# Patient Record
Sex: Female | Born: 1980 | Race: Asian | Hispanic: No | Marital: Married | Smoking: Smoker, current status unknown
Health system: Southern US, Community
[De-identification: ages and names within clinical notes are randomized; demographics above are authoritative.]

## PROBLEM LIST (undated history)

## (undated) DIAGNOSIS — I10 Essential (primary) hypertension: Secondary | ICD-10-CM

## (undated) HISTORY — DX: Essential (primary) hypertension: I10

---

## 2014-01-09 LAB — OB RESULTS CONSOLE ABO/RH: RH TYPE: POSITIVE

## 2015-02-19 LAB — OB RESULTS CONSOLE HGB/HCT, BLOOD
HEMATOCRIT: 37 %
HEMOGLOBIN: 12.4 g/dL

## 2015-02-19 LAB — OB RESULTS CONSOLE HIV ANTIBODY (ROUTINE TESTING): HIV: NONREACTIVE

## 2015-02-19 LAB — OB RESULTS CONSOLE PLATELET COUNT: PLATELETS: 361 10*3/uL

## 2015-02-19 LAB — OB RESULTS CONSOLE ANTIBODY SCREEN: Antibody Screen: NEGATIVE

## 2015-02-19 LAB — OB RESULTS CONSOLE RPR
RPR: NONREACTIVE
RPR: NONREACTIVE

## 2015-02-19 LAB — OB RESULTS CONSOLE GC/CHLAMYDIA
CHLAMYDIA, DNA PROBE: NEGATIVE
Chlamydia: NEGATIVE
GC PROBE AMP, GENITAL: NEGATIVE
Gonorrhea: NEGATIVE

## 2015-02-19 LAB — OB RESULTS CONSOLE VARICELLA ZOSTER ANTIBODY, IGG: Varicella: IMMUNE

## 2015-02-19 LAB — OB RESULTS CONSOLE RUBELLA ANTIBODY, IGM: RUBELLA: IMMUNE

## 2015-02-19 LAB — OB RESULTS CONSOLE HEPATITIS B SURFACE ANTIGEN: Hepatitis B Surface Ag: NEGATIVE

## 2015-04-06 ENCOUNTER — Encounter: Payer: Self-pay | Admitting: *Deleted

## 2015-04-06 DIAGNOSIS — O0990 Supervision of high risk pregnancy, unspecified, unspecified trimester: Secondary | ICD-10-CM | POA: Insufficient documentation

## 2015-04-06 DIAGNOSIS — O169 Unspecified maternal hypertension, unspecified trimester: Secondary | ICD-10-CM

## 2015-04-09 ENCOUNTER — Encounter: Payer: Self-pay | Admitting: *Deleted

## 2015-04-26 ENCOUNTER — Encounter: Payer: Self-pay | Admitting: Family

## 2015-04-26 ENCOUNTER — Ambulatory Visit (INDEPENDENT_AMBULATORY_CARE_PROVIDER_SITE_OTHER): Payer: Medicaid Other | Admitting: Family

## 2015-04-26 VITALS — BP 120/73 | HR 94 | Temp 98.2°F | Ht 59.0 in | Wt 175.3 lb

## 2015-04-26 DIAGNOSIS — O0990 Supervision of high risk pregnancy, unspecified, unspecified trimester: Secondary | ICD-10-CM

## 2015-04-26 DIAGNOSIS — O162 Unspecified maternal hypertension, second trimester: Secondary | ICD-10-CM

## 2015-04-26 LAB — POCT URINALYSIS DIP (DEVICE)
BILIRUBIN URINE: NEGATIVE
GLUCOSE, UA: NEGATIVE mg/dL
Hgb urine dipstick: NEGATIVE
KETONES UR: NEGATIVE mg/dL
LEUKOCYTES UA: NEGATIVE
Nitrite: NEGATIVE
Protein, ur: NEGATIVE mg/dL
SPECIFIC GRAVITY, URINE: 1.015 (ref 1.005–1.030)
UROBILINOGEN UA: 0.2 mg/dL (ref 0.0–1.0)
pH: 7 (ref 5.0–8.0)

## 2015-04-26 NOTE — Progress Notes (Signed)
   Subjective:    Tamara Lawson is a G2P1001 637w4d being seen today for her first obstetrical visit.  Her obstetrical history is significant for history of chronic hypertension for "a long time", unable to tell the year.  Does not recall taking medication in past.  Transferred from [facility]. Patient does not intend to breast feed. Pregnancy history fully reviewed.  Patient reports no complaints.  Filed Vitals:   04/26/15 0814 04/26/15 0833  BP: 120/73   Pulse: 94   Temp: 98.2 F (36.8 C)   Height:  4\' 11"  (1.499 m)  Weight: 175 lb 4.8 oz (79.516 kg)     HISTORY: OB History  Gravida Para Term Preterm AB SAB TAB Ectopic Multiple Living  2 1 1  0 0 0 0 0 0 1    # Outcome Date GA Lbr Len/2nd Weight Sex Delivery Anes PTL Lv  2 Current           1 Term 06/22/14 7392w0d  8 lb 4 oz (3.742 kg) F Vag-Spont None  Y    Obstetric Comments  Uncomplicated per pt   Past Medical History  Diagnosis Date  . Hypertension     Unable to tell year; "long time"   No past surgical history on file. No family history on file.   Exam    Filed Vitals:   04/26/15 0814 04/26/15 0833  BP: 120/73   Pulse: 94   Temp: 98.2 F (36.8 C)   Height:  4\' 11"  (1.499 m)  Weight: 175 lb 4.8 oz (79.516 kg)     Fetal Status: Fetal Heart Rate (bpm): 152 Fundal Height: 25 cm Movement: Present     General:  Alert, oriented and cooperative. Patient is in no acute distress.  Skin: Skin is warm and dry. No rash noted.   Cardiovascular: Normal heart rate noted  Respiratory: Normal respiratory effort, no problems with respiration noted  Abdomen: Soft, gravid, appropriate for gestational age. Pain/Pressure: Absent     Pelvic: Vag. Bleeding: None     Cervical exam deferred        Extremities: Normal range of motion.  Edema: None  Mental Status: Normal mood and affect. Normal behavior. Normal judgment and thought content.   Urinalysis: Urine Protein: Negative Urine Glucose: Negative     Assessment:    Pregnancy: G2P1001 Patient Active Problem List   Diagnosis Date Noted  . Supervision of high-risk pregnancy 04/06/2015  . Hypertension in pregnancy 04/06/2015        Plan:     Initial labs drawn. Obt[facility] ultrasound [facility]. Problem list reviewed and updated.  Ultrasound discussed; fetal survey: ordered.  Follow up in 3 weeks.   Marlis EdelsonKARIM, Jenaya Saar N 04/26/2015

## 2015-04-26 NOTE — Progress Notes (Signed)
Language Line Used ID: [id number]

## 2015-04-26 NOTE — Progress Notes (Signed)
Anatomy U/S 05/02/15 @ 2p.

## 2015-05-02 ENCOUNTER — Other Ambulatory Visit: Payer: Self-pay | Admitting: Family

## 2015-05-02 ENCOUNTER — Ambulatory Visit (HOSPITAL_COMMUNITY): Admission: RE | Admit: 2015-05-02 | Payer: Medicaid Other | Source: Ambulatory Visit

## 2015-05-02 DIAGNOSIS — O10919 Unspecified pre-existing hypertension complicating pregnancy, unspecified trimester: Secondary | ICD-10-CM

## 2015-05-02 DIAGNOSIS — Z3689 Encounter for other specified antenatal screening: Secondary | ICD-10-CM

## 2015-05-02 DIAGNOSIS — Z3A25 25 weeks gestation of pregnancy: Secondary | ICD-10-CM

## 2015-05-07 ENCOUNTER — Ambulatory Visit (HOSPITAL_COMMUNITY): Payer: Medicaid Other

## 2015-05-08 ENCOUNTER — Ambulatory Visit (HOSPITAL_COMMUNITY)
Admission: RE | Admit: 2015-05-08 | Discharge: 2015-05-08 | Disposition: A | Discharge status: Home / Self Care | Payer: Medicaid Other | Source: Ambulatory Visit | Attending: Family | Admitting: Family

## 2015-05-08 DIAGNOSIS — Z3689 Encounter for other specified antenatal screening: Secondary | ICD-10-CM

## 2015-05-08 DIAGNOSIS — Z36 Encounter for antenatal screening of mother: Secondary | ICD-10-CM | POA: Diagnosis not present

## 2015-05-08 DIAGNOSIS — O10012 Pre-existing essential hypertension complicating pregnancy, second trimester: Secondary | ICD-10-CM | POA: Insufficient documentation

## 2015-05-08 DIAGNOSIS — O0990 Supervision of high risk pregnancy, unspecified, unspecified trimester: Secondary | ICD-10-CM

## 2015-05-08 DIAGNOSIS — O10919 Unspecified pre-existing hypertension complicating pregnancy, unspecified trimester: Secondary | ICD-10-CM

## 2015-05-08 DIAGNOSIS — Z3A25 25 weeks gestation of pregnancy: Secondary | ICD-10-CM

## 2015-05-08 DIAGNOSIS — Z3A26 26 weeks gestation of pregnancy: Secondary | ICD-10-CM | POA: Diagnosis not present

## 2015-05-17 ENCOUNTER — Telehealth: Payer: Self-pay | Admitting: *Deleted

## 2015-05-17 ENCOUNTER — Encounter: Payer: Self-pay | Admitting: Family Medicine

## 2015-05-17 ENCOUNTER — Ambulatory Visit (INDEPENDENT_AMBULATORY_CARE_PROVIDER_SITE_OTHER): Payer: Medicaid Other | Admitting: Family Medicine

## 2015-05-17 VITALS — BP 121/75 | HR 95 | Temp 99.5°F | Wt 178.0 lb

## 2015-05-17 DIAGNOSIS — O0992 Supervision of high risk pregnancy, unspecified, second trimester: Secondary | ICD-10-CM

## 2015-05-17 DIAGNOSIS — O162 Unspecified maternal hypertension, second trimester: Secondary | ICD-10-CM

## 2015-05-17 DIAGNOSIS — Z789 Other specified health status: Secondary | ICD-10-CM | POA: Diagnosis not present

## 2015-05-17 LAB — CBC
HEMATOCRIT: 31.3 % — AB (ref 36.0–46.0)
HEMOGLOBIN: 11 g/dL — AB (ref 12.0–15.0)
MCH: 30.3 pg (ref 26.0–34.0)
MCHC: 35.1 g/dL (ref 30.0–36.0)
MCV: 86.2 fL (ref 78.0–100.0)
MPV: 9 fL (ref 8.6–12.4)
Platelets: 295 10*3/uL (ref 150–400)
RBC: 3.63 MIL/uL — ABNORMAL LOW (ref 3.87–5.11)
RDW: 13.5 % (ref 11.5–15.5)
WBC: 10.7 10*3/uL — AB (ref 4.0–10.5)

## 2015-05-17 LAB — POCT URINALYSIS DIP (DEVICE)
BILIRUBIN URINE: NEGATIVE
GLUCOSE, UA: NEGATIVE mg/dL
Ketones, ur: NEGATIVE mg/dL
NITRITE: NEGATIVE
Protein, ur: NEGATIVE mg/dL
Specific Gravity, Urine: 1.015 (ref 1.005–1.030)
UROBILINOGEN UA: 0.2 mg/dL (ref 0.0–1.0)
pH: 7 (ref 5.0–8.0)

## 2015-05-17 MED ORDER — ASPIRIN 81 MG PO CHEW: 81.0000 mg | CHEWABLE_TABLET | Freq: Every day | ORAL | Status: DC

## 2015-05-17 NOTE — Telephone Encounter (Signed)
Attempted to call patient regarding name of pharmacy so that we can call her medicine in.

## 2015-05-17 NOTE — Progress Notes (Signed)
Subjective:  Tamara Lawson is a 34 y.o. G2P1001 at 4218w4d being seen today for ongoing prenatal care.  She is currently monitored for the following issues for this high-risk pregnancy and has Supervision of high-risk pregnancy; Hypertension in pregnancy; and Language barrier on her problem list.  Patient reports no complaints.  Contractions: Not present.  .  Movement: Present. Denies leaking of fluid.   The following portions of the patient's history were reviewed and updated as appropriate: allergies, current medications, past family history, past medical history, past social history, past surgical history and problem list. Problem list updated.  Objective:   Filed Vitals:   05/17/15 1117  BP: 121/75  Pulse: 95  Temp: 99.5 F (37.5 C)  Weight: 178 lb (80.74 kg)    Fetal Status: Fetal Heart Rate (bpm): 150 Fundal Height: 27 cm Movement: Present     General:  Alert, oriented and cooperative. Patient is in no acute distress.  Skin: Skin is warm and dry. No rash noted.   Cardiovascular: Normal heart rate noted  Respiratory: Normal respiratory effort, no problems with respiration noted  Abdomen: Soft, gravid, appropriate for gestational age. Pain/Pressure: Present     Extremities: Normal range of motion.  Edema: Trace  Mental Status: Normal mood and affect. Normal behavior. Normal judgment and thought content.   Urinalysis: Urine Protein: Negative Urine Glucose: Negative  Assessment and Plan:  Pregnancy: G2P1001 at 1918w4d  1. Hypertension in pregnancy, second trimester U/s for growth in 4 wks Begin ASA - US MFM OB FOLLOW UP; Future - aspirin 81 MG chewable tablet; Chew 1 tablet (81 mg total) by mouth daily.  Dispense: 90 tablet; Refill: 1  2. Supervision of high-risk pregnancy, second trimester 28 wk labs - CBC - HIV antibody - RPR - Glucose Tolerance, 1 HR (50g)  3. Language barrier [place] interpreter used--has transportation issues--will have her see SW   Preterm labor  symptoms and general obstetric precautions including but not limited to vaginal bleeding, contractions, leaking of fluid and fetal movement were reviewed in detail with the patient. Please refer to After Visit Summary for other counseling recommendations.  Return in 2 weeks (on 05/31/2015).   Reva Boresanya S Tayten Heber, MD

## 2015-05-17 NOTE — Telephone Encounter (Signed)
Patient left without getting prescription for ASA 81mg .  No pharmacy listed, cannot eprescribe. Called patient with Clydie BraunKaren interpreter via [employer]. No answer. Need to call patient back to pick up rx or give us a pharmacy to eprescribe. Next appt not until 05/31/15.

## 2015-05-17 NOTE — Patient Instructions (Signed)
Third Trimester of Pregnancy The third trimester is from week 29 through week 42, months 7 through 9. The third trimester is a time when the fetus is growing rapidly. At the end of the ninth month, the fetus is about 20 inches in length and weighs 6-10 pounds.  BODY CHANGES Your body goes through many changes during pregnancy. The changes vary from woman to woman.   Your weight will continue to increase. You can expect to gain 25-35 pounds (11-16 kg) by the end of the pregnancy.  You may begin to get stretch marks on your hips, abdomen, and breasts.  You may urinate more often because the fetus is moving lower into your pelvis and pressing on your bladder.  You may develop or continue to have heartburn as a result of your pregnancy.  You may develop constipation because certain hormones are causing the muscles that push waste through your intestines to slow down.  You may develop hemorrhoids or swollen, bulging veins (varicose veins).  You may have pelvic pain because of the weight gain and pregnancy hormones relaxing your joints between the bones in your pelvis. Backaches may result from overexertion of the muscles supporting your posture.  You may have changes in your hair. These can include thickening of your hair, rapid growth, and changes in texture. Some women also have hair loss during or after pregnancy, or hair that feels dry or thin. Your hair will most likely return to normal after your baby is born.  Your breasts will continue to grow and be tender. A yellow discharge may leak from your breasts called colostrum.  Your belly button may stick out.  You may feel short of breath because of your expanding uterus.  You may notice the fetus "dropping," or moving lower in your abdomen.  You may have a bloody mucus discharge. This usually occurs a few days to a week before labor begins.  Your cervix becomes thin and soft (effaced) near your due date. WHAT TO EXPECT AT YOUR  PRENATAL EXAMS  You will have prenatal exams every 2 weeks until week 36. Then, you will have weekly prenatal exams. During a routine prenatal visit:  You will be weighed to make sure you and the fetus are growing normally.  Your blood pressure is taken.  Your abdomen will be measured to track your baby's growth.  The fetal heartbeat will be listened to.  Any test results from the previous visit will be discussed.  You may have a cervical check near your due date to see if you have effaced. At around 36 weeks, your caregiver will check your cervix. At the same time, your caregiver will also perform a test on the secretions of the vaginal tissue. This test is to determine if a type of bacteria, Group B streptococcus, is present. Your caregiver will explain this further. Your caregiver may ask you:  What your birth plan is.  How you are feeling.  If you are feeling the baby move.  If you have had any abnormal symptoms, such as leaking fluid, bleeding, severe headaches, or abdominal cramping.  If you are using any tobacco products, including cigarettes, chewing tobacco, and electronic cigarettes.  If you have any questions. Other tests or screenings that may be performed during your third trimester include:  Blood tests that check for low iron levels (anemia).  Fetal testing to check the health, activity level, and growth of the fetus. Testing is done if you have certain medical conditions or if   there are problems during the pregnancy.  HIV (human immunodeficiency virus) testing. If you are at high risk, you may be screened for HIV during your third trimester of pregnancy. FALSE LABOR You may feel small, irregular contractions that eventually go away. These are called Braxton Hicks contractions, or false labor. Contractions may last for hours, days, or even weeks before true labor sets in. If contractions come at regular intervals, intensify, or become painful, it is best to be seen  by your caregiver.  SIGNS OF LABOR   Menstrual-like cramps.  Contractions that are 5 minutes apart or less.  Contractions that start on the top of the uterus and spread down to the lower abdomen and back.  A sense of increased pelvic pressure or back pain.  A watery or bloody mucus discharge that comes from the vagina. If you have any of these signs before the 37th week of pregnancy, call your caregiver right away. You need to go to the hospital to get checked immediately. HOME CARE INSTRUCTIONS   Avoid all smoking, herbs, alcohol, and unprescribed drugs. These chemicals affect the formation and growth of the baby.  Do not use any tobacco products, including cigarettes, chewing tobacco, and electronic cigarettes. If you need help quitting, ask your health care provider. You may receive counseling support and other resources to help you quit.  Follow your caregiver's instructions regarding medicine use. There are medicines that are either safe or unsafe to take during pregnancy.  Exercise only as directed by your caregiver. Experiencing uterine cramps is a good sign to stop exercising.  Continue to eat regular, healthy meals.  Wear a good support bra for breast tenderness.  Do not use hot tubs, steam rooms, or saunas.  Wear your seat belt at all times when driving.  Avoid raw meat, uncooked cheese, cat litter boxes, and soil used by cats. These carry germs that can cause birth defects in the baby.  Take your prenatal vitamins.  Take 1500-2000 mg of calcium daily starting at the 20th week of pregnancy until you deliver your baby.  Try taking a stool softener (if your caregiver approves) if you develop constipation. Eat more high-fiber foods, such as fresh vegetables or fruit and whole grains. Drink plenty of fluids to keep your urine clear or pale yellow.  Take warm sitz baths to soothe any pain or discomfort caused by hemorrhoids. Use hemorrhoid cream if your caregiver  approves.  If you develop varicose veins, wear support hose. Elevate your feet for 15 minutes, 3-4 times a day. Limit salt in your diet.  Avoid heavy lifting, wear low heal shoes, and practice good posture.  Rest a lot with your legs elevated if you have leg cramps or low back pain.  Visit your dentist if you have not gone during your pregnancy. Use a soft toothbrush to brush your teeth and be gentle when you floss.  A sexual relationship may be continued unless your caregiver directs you otherwise.  Do not travel far distances unless it is absolutely necessary and only with the approval of your caregiver.  Take prenatal classes to understand, practice, and ask questions about the labor and delivery.  Make a trial run to the hospital.  Pack your hospital bag.  Prepare the baby's nursery.  Continue to go to all your prenatal visits as directed by your caregiver. SEEK MEDICAL CARE IF:  You are unsure if you are in labor or if your water has broken.  You have dizziness.  You have   mild pelvic cramps, pelvic pressure, or nagging pain in your abdominal area.  You have persistent nausea, vomiting, or diarrhea.  You have a bad smelling vaginal discharge.  You have pain with urination. SEEK IMMEDIATE MEDICAL CARE IF:   You have a fever.  You are leaking fluid from your vagina.  You have spotting or bleeding from your vagina.  You have severe abdominal cramping or pain.  You have rapid weight loss or gain.  You have shortness of breath with chest pain.  You notice sudden or extreme swelling of your face, hands, ankles, feet, or legs.  You have not felt your baby move in over an hour.  You have severe headaches that do not go away with medicine.  You have vision changes.   This information is not intended to replace advice given to you by your health care provider. Make sure you discuss any questions you have with your health care provider.   Document Released:  04/29/2001 Document Revised: 05/26/2014 Document Reviewed: 07/06/2012 Elsevier Interactive Patient Education 2016 [employer]  Breastfeeding Deciding to breastfeed is one of the best choices you can make for you and your baby. A change in hormones during pregnancy causes your breast tissue to grow and increases the number and size of your milk ducts. These hormones also allow proteins, sugars, and fats from your blood supply to make breast milk in your milk-producing glands. Hormones prevent breast milk from being released before your baby is born as well as prompt milk flow after birth. Once breastfeeding has begun, thoughts of your baby, as well as his or her sucking or crying, can stimulate the release of milk from your milk-producing glands.  BENEFITS OF BREASTFEEDING For Your Baby  Your first milk (colostrum) helps your baby's digestive system function better.  There are antibodies in your milk that help your baby fight off infections.  Your baby has a lower incidence of asthma, allergies, and sudden infant death syndrome.  The nutrients in breast milk are better for your baby than infant formulas and are designed uniquely for your baby's needs.  Breast milk improves your baby's brain development.  Your baby is less likely to develop other conditions, such as childhood obesity, asthma, or type 2 diabetes mellitus. For You  Breastfeeding helps to create a very special bond between you and your baby.  Breastfeeding is convenient. Breast milk is always available at the correct temperature and costs nothing.  Breastfeeding helps to burn calories and helps you lose the weight gained during pregnancy.  Breastfeeding makes your uterus contract to its prepregnancy size faster and slows bleeding (lochia) after you give birth.   Breastfeeding helps to lower your risk of developing type 2 diabetes mellitus, osteoporosis, and breast or ovarian cancer later in life. SIGNS THAT YOUR BABY IS  HUNGRY Early Signs of Hunger  Increased alertness or activity.  Stretching.  Movement of the head from side to side.  Movement of the head and opening of the mouth when the corner of the mouth or cheek is stroked (rooting).  Increased sucking sounds, smacking lips, cooing, sighing, or squeaking.  Hand-to-mouth movements.  Increased sucking of fingers or hands. Late Signs of Hunger  Fussing.  Intermittent crying. Extreme Signs of Hunger Signs of extreme hunger will require calming and consoling before your baby will be able to breastfeed successfully. Do not wait for the following signs of extreme hunger to occur before you initiate breastfeeding:  Restlessness.  A loud, strong cry.  Screaming.   BREASTFEEDING BASICS Breastfeeding Initiation  Find a comfortable place to sit or lie down, with your neck and back well supported.  Place a pillow or rolled up blanket under your baby to bring him or her to the level of your breast (if you are seated). Nursing pillows are specially designed to help support your arms and your baby while you breastfeed.  Make sure that your baby's abdomen is facing your abdomen.  Gently massage your breast. With your fingertips, massage from your chest wall toward your nipple in a circular motion. This encourages milk flow. You may need to continue this action during the feeding if your milk flows slowly.  Support your breast with 4 fingers underneath and your thumb above your nipple. Make sure your fingers are well away from your nipple and your baby's mouth.  Stroke your baby's lips gently with your finger or nipple.  When your baby's mouth is open wide enough, quickly bring your baby to your breast, placing your entire nipple and as much of the colored area around your nipple (areola) as possible into your baby's mouth.  More areola should be visible above your baby's upper lip than below the lower lip.  Your baby's tongue should be between his  or her lower gum and your breast.  Ensure that your baby's mouth is correctly positioned around your nipple (latched). Your baby's lips should create a seal on your breast and be turned out (everted).  It is common for your baby to suck about 2-3 minutes in order to start the flow of breast milk. Latching Teaching your baby how to latch on to your breast properly is very important. An improper latch can cause nipple pain and decreased milk supply for you and poor weight gain in your baby. Also, if your baby is not latched onto your nipple properly, he or she may swallow some air during feeding. This can make your baby fussy. Burping your baby when you switch breasts during the feeding can help to get rid of the air. However, teaching your baby to latch on properly is still the best way to prevent fussiness from swallowing air while breastfeeding. Signs that your baby has successfully latched on to your nipple:  Silent tugging or silent sucking, without causing you pain.  Swallowing heard between every 3-4 sucks.  Muscle movement above and in front of his or her ears while sucking. Signs that your baby has not successfully latched on to nipple:  Sucking sounds or smacking sounds from your baby while breastfeeding.  Nipple pain. If you think your baby has not latched on correctly, slip your finger into the corner of your baby's mouth to break the suction and place it between your baby's gums. Attempt breastfeeding initiation again. Signs of Successful Breastfeeding Signs from your baby:  A gradual decrease in the number of sucks or complete cessation of sucking.  Falling asleep.  Relaxation of his or her body.  Retention of a small amount of milk in his or her mouth.  Letting go of your breast by himself or herself. Signs from you:  Breasts that have increased in firmness, weight, and size 1-3 hours after feeding.  Breasts that are softer immediately after  breastfeeding.  Increased milk volume, as well as a change in milk consistency and color by the fifth day of breastfeeding.  Nipples that are not sore, cracked, or bleeding. Signs That Your Baby is Getting Enough Milk  Wetting at least 3 diapers in a 24-hour period.   The urine should be clear and pale yellow by age 5 days.  At least 3 stools in a 24-hour period by age 5 days. The stool should be soft and yellow.  At least 3 stools in a 24-hour period by age 7 days. The stool should be seedy and yellow.  No loss of weight greater than 10% of birth weight during the first 3 days of age.  Average weight gain of 4-7 ounces (113-198 g) per week after age 4 days.  Consistent daily weight gain by age 5 days, without weight loss after the age of 2 weeks. After a feeding, your baby may spit up a small amount. This is common. BREASTFEEDING FREQUENCY AND DURATION Frequent feeding will help you make more milk and can prevent sore nipples and breast engorgement. Breastfeed when you feel the need to reduce the fullness of your breasts or when your baby shows signs of hunger. This is called "breastfeeding on demand." Avoid introducing a pacifier to your baby while you are working to establish breastfeeding (the first 4-6 weeks after your baby is born). After this time you may choose to use a pacifier. Research has shown that pacifier use during the first year of a baby's life decreases the risk of sudden infant death syndrome (SIDS). Allow your baby to feed on each breast as long as he or she wants. Breastfeed until your baby is finished feeding. When your baby unlatches or falls asleep while feeding from the first breast, offer the second breast. Because newborns are often sleepy in the first few weeks of life, you may need to awaken your baby to get him or her to feed. Breastfeeding times will vary from baby to baby. However, the following rules can serve as a guide to help you ensure that your baby is  properly fed:  Newborns (babies 4 weeks of age or younger) may breastfeed every 1-3 hours.  Newborns should not go longer than 3 hours during the day or 5 hours during the night without breastfeeding.  You should breastfeed your baby a minimum of 8 times in a 24-hour period until you begin to introduce solid foods to your baby at around 6 months of age. BREAST MILK PUMPING Pumping and storing breast milk allows you to ensure that your baby is exclusively fed your breast milk, even at times when you are unable to breastfeed. This is especially important if you are going back to work while you are still breastfeeding or when you are not able to be present during feedings. Your lactation consultant can give you guidelines on how long it is safe to store breast milk. A breast pump is a machine that allows you to pump milk from your breast into a sterile bottle. The pumped breast milk can then be stored in a refrigerator or freezer. Some breast pumps are operated by hand, while others use electricity. Ask your lactation consultant which type will work best for you. Breast pumps can be purchased, but some hospitals and breastfeeding support groups lease breast pumps on a monthly basis. A lactation consultant can teach you how to hand express breast milk, if you prefer not to use a pump. CARING FOR YOUR BREASTS WHILE YOU BREASTFEED Nipples can become dry, cracked, and sore while breastfeeding. The following recommendations can help keep your breasts moisturized and healthy:  Avoid using soap on your nipples.  Wear a supportive bra. Although not required, special nursing bras and tank tops are designed to allow access to your   breasts for breastfeeding without taking off your entire bra or top. Avoid wearing underwire-style bras or extremely tight bras.  Air dry your nipples for 3-4minutes after each feeding.  Use only cotton bra pads to absorb leaked breast milk. Leaking of breast milk between feedings  is normal.  Use lanolin on your nipples after breastfeeding. Lanolin helps to maintain your skin's normal moisture barrier. If you use pure lanolin, you do not need to wash it off before feeding your baby again. Pure lanolin is not toxic to your baby. You may also hand express a few drops of breast milk and gently massage that milk into your nipples and allow the milk to air dry. In the first few weeks after giving birth, some women experience extremely full breasts (engorgement). Engorgement can make your breasts feel heavy, warm, and tender to the touch. Engorgement peaks within 3-5 days after you give birth. The following recommendations can help ease engorgement:  Completely empty your breasts while breastfeeding or pumping. You may want to start by applying warm, moist heat (in the shower or with warm water-soaked hand towels) just before feeding or pumping. This increases circulation and helps the milk flow. If your baby does not completely empty your breasts while breastfeeding, pump any extra milk after he or she is finished.  Wear a snug bra (nursing or regular) or tank top for 1-2 days to signal your body to slightly decrease milk production.  Apply ice packs to your breasts, unless this is too uncomfortable for you.  Make sure that your baby is latched on and positioned properly while breastfeeding. If engorgement persists after 48 hours of following these recommendations, contact your health care provider or a lactation consultant. OVERALL HEALTH CARE RECOMMENDATIONS WHILE BREASTFEEDING  Eat healthy foods. Alternate between meals and snacks, eating 3 of each per day. Because what you eat affects your breast milk, some of the foods may make your baby more irritable than usual. Avoid eating these foods if you are sure that they are negatively affecting your baby.  Drink milk, fruit juice, and water to satisfy your thirst (about 10 glasses a day).  Rest often, relax, and continue to take  your prenatal vitamins to prevent fatigue, stress, and anemia.  Continue breast self-awareness checks.  Avoid chewing and smoking tobacco. Chemicals from cigarettes that pass into breast milk and exposure to secondhand smoke may harm your baby.  Avoid alcohol and drug use, including marijuana. Some medicines that may be harmful to your baby can pass through breast milk. It is important to ask your health care provider before taking any medicine, including all over-the-counter and prescription medicine as well as vitamin and herbal supplements. It is possible to become pregnant while breastfeeding. If birth control is desired, ask your health care provider about options that will be safe for your baby. SEEK MEDICAL CARE IF:  You feel like you want to stop breastfeeding or have become frustrated with breastfeeding.  You have painful breasts or nipples.  Your nipples are cracked or bleeding.  Your breasts are red, tender, or warm.  You have a swollen area on either breast.  You have a fever or chills.  You have nausea or vomiting.  You have drainage other than breast milk from your nipples.  Your breasts do not become full before feedings by the fifth day after you give birth.  You feel sad and depressed.  Your baby is too sleepy to eat well.  Your baby is having trouble sleeping.     Your baby is wetting less than 3 diapers in a 24-hour period.  Your baby has less than 3 stools in a 24-hour period.  Your baby's skin or the white part of his or her eyes becomes yellow.   Your baby is not gaining weight by 5 days of age. SEEK IMMEDIATE MEDICAL CARE IF:  Your baby is overly tired (lethargic) and does not want to wake up and feed.  Your baby develops an unexplained fever.   This information is not intended to replace advice given to you by your health care provider. Make sure you discuss any questions you have with your health care provider.   Document Released: 05/05/2005  Document Revised: 01/24/2015 Document Reviewed: 10/27/2012 Elsevier Interactive Patient Education 2016 [employer]  

## 2015-05-18 LAB — GLUCOSE TOLERANCE, 1 HOUR (50G) W/O FASTING: GLUCOSE 1 HOUR GTT: 115 mg/dL (ref 70–140)

## 2015-05-18 LAB — HIV ANTIBODY (ROUTINE TESTING W REFLEX): HIV: NONREACTIVE

## 2015-05-18 LAB — RPR

## 2015-05-24 MED ORDER — ASPIRIN 81 MG PO CHEW: 81.0000 mg | CHEWABLE_TABLET | Freq: Every day | ORAL | Status: AC

## 2015-05-24 NOTE — Addendum Note (Signed)
Addended by: Garret ReddishBARNES, Amiah Frohlich M on: 05/24/2015 05:14 PM   Modules accepted: Orders

## 2015-05-24 NOTE — Telephone Encounter (Signed)
Called patient using [employer] language line Clydie BraunKaren interpreter. Obtained pharmacy info from patient and sent in prescription. Patient voiced understanding of taking ASA 81mg  daily. She asked about transportation to her next appointment,said he had medicaid and was trying to contact transportation but had not heard back from anyone. Will inbox the front office staff so that they can help her with this. Patient voiced understanding.

## 2015-05-31 ENCOUNTER — Ambulatory Visit (INDEPENDENT_AMBULATORY_CARE_PROVIDER_SITE_OTHER): Payer: Medicaid Other | Admitting: Family Medicine

## 2015-05-31 VITALS — BP 128/78 | HR 96 | Temp 98.6°F | Wt 178.1 lb

## 2015-05-31 DIAGNOSIS — O162 Unspecified maternal hypertension, second trimester: Secondary | ICD-10-CM

## 2015-05-31 DIAGNOSIS — Z789 Other specified health status: Secondary | ICD-10-CM | POA: Diagnosis not present

## 2015-05-31 DIAGNOSIS — O0992 Supervision of high risk pregnancy, unspecified, second trimester: Secondary | ICD-10-CM

## 2015-05-31 LAB — POCT URINALYSIS DIP (DEVICE)
BILIRUBIN URINE: NEGATIVE
GLUCOSE, UA: NEGATIVE mg/dL
HGB URINE DIPSTICK: NEGATIVE
KETONES UR: NEGATIVE mg/dL
Nitrite: NEGATIVE
Protein, ur: NEGATIVE mg/dL
SPECIFIC GRAVITY, URINE: 1.01 (ref 1.005–1.030)
Urobilinogen, UA: 0.2 mg/dL (ref 0.0–1.0)
pH: 6 (ref 5.0–8.0)

## 2015-05-31 NOTE — Progress Notes (Signed)
Subjective:  Tamara Lawson is a 35 y.o. G2P1001 at 5143w4d being seen today for ongoing prenatal care.  She is currently monitored for the following issues for this high-risk pregnancy and has Supervision of high-risk pregnancy; Hypertension in pregnancy; and Language barrier on her problem list.  Patient reports no complaints.  Contractions: Not present.  .  Movement: Present. Denies leaking of fluid.   The following portions of the patient's history were reviewed and updated as appropriate: allergies, current medications, past family history, past medical history, past social history, past surgical history and problem list. Problem list updated.  Objective:   Filed Vitals:   05/31/15 1047  BP: 128/78  Pulse: 96  Temp: 98.6 F (37 C)  Weight: 178 lb 1.6 oz (80.786 kg)    Fetal Status:   Fundal Height: 30 cm Movement: Present  Presentation: Vertex  General:  Alert, oriented and cooperative. Patient is in no acute distress.  Skin: Skin is warm and dry. No rash noted.   Cardiovascular: Normal heart rate noted  Respiratory: Normal respiratory effort, no problems with respiration noted  Abdomen: Soft, gravid, appropriate for gestational age. Pain/Pressure: Present     Pelvic:       Cervical exam deferred        Extremities: Normal range of motion.  Edema: None  Mental Status: Normal mood and affect. Normal behavior. Normal judgment and thought content.   Urinalysis:      Assessment and Plan:  Pregnancy: G2P1001 at 9043w4d  1. Hypertension in pregnancy, second trimester BP wnl today Discussed starting NST testing next visit, she is concerned about transportation, perhaps BPP weekly will work better --discuss next visit  2. Supervision of high-risk pregnancy, second trimester Updated box  3. Language barrier Interpreter present  Preterm labor symptoms and general obstetric precautions including but not limited to vaginal bleeding, contractions, leaking of fluid and fetal movement were  reviewed in detail with the patient. Please refer to After Visit Summary for other counseling recommendations.  Return in about 3 weeks (around 06/21/2015) for Routine prenatal care, start NST2xweekly at this visit.  Future Appointments Date Time Provider Department Center  06/14/2015 9:30 AM [facility] US 2 [facility]  06/21/2015 10:45 AM Adam PhenixJames G Arnold[facility]   Federico FlakeKimberly Niles Chakira Jachim, MD

## 2015-05-31 NOTE — Patient Instructions (Signed)

## 2015-06-13 ENCOUNTER — Other Ambulatory Visit: Payer: Self-pay | Admitting: Family Medicine

## 2015-06-13 DIAGNOSIS — O10913 Unspecified pre-existing hypertension complicating pregnancy, third trimester: Secondary | ICD-10-CM

## 2015-06-13 DIAGNOSIS — Z3A31 31 weeks gestation of pregnancy: Secondary | ICD-10-CM

## 2015-06-14 ENCOUNTER — Ambulatory Visit (HOSPITAL_COMMUNITY)
Admission: RE | Admit: 2015-06-14 | Discharge: 2015-06-14 | Disposition: A | Discharge status: Home / Self Care | Payer: Medicaid Other | Source: Ambulatory Visit | Attending: Family Medicine | Admitting: Family Medicine

## 2015-06-14 ENCOUNTER — Encounter (HOSPITAL_COMMUNITY): Payer: Self-pay

## 2015-06-14 DIAGNOSIS — O10913 Unspecified pre-existing hypertension complicating pregnancy, third trimester: Secondary | ICD-10-CM

## 2015-06-14 DIAGNOSIS — O10013 Pre-existing essential hypertension complicating pregnancy, third trimester: Secondary | ICD-10-CM | POA: Insufficient documentation

## 2015-06-14 DIAGNOSIS — Z3A31 31 weeks gestation of pregnancy: Secondary | ICD-10-CM | POA: Diagnosis not present

## 2015-06-21 ENCOUNTER — Ambulatory Visit (INDEPENDENT_AMBULATORY_CARE_PROVIDER_SITE_OTHER): Payer: Medicaid Other | Admitting: Obstetrics & Gynecology

## 2015-06-21 VITALS — BP 115/75 | HR 95 | Temp 98.0°F | Wt 180.0 lb

## 2015-06-21 DIAGNOSIS — O163 Unspecified maternal hypertension, third trimester: Secondary | ICD-10-CM

## 2015-06-21 LAB — POCT URINALYSIS DIP (DEVICE)
Bilirubin Urine: NEGATIVE
GLUCOSE, UA: NEGATIVE mg/dL
Hgb urine dipstick: NEGATIVE
Ketones, ur: 15 mg/dL — AB
NITRITE: NEGATIVE
PH: 7 (ref 5.0–8.0)
PROTEIN: NEGATIVE mg/dL
Specific Gravity, Urine: 1.015 (ref 1.005–1.030)
Urobilinogen, UA: 0.2 mg/dL (ref 0.0–1.0)

## 2015-06-21 NOTE — Progress Notes (Signed)
Used [phone].

## 2015-06-21 NOTE — Progress Notes (Signed)
Normal growth on [place] 1 week ago Subjective:  Tamara Lawson is a 35 y.o. G2P1001 at [redacted]w[redacted]d being seen today for ongoing prenatal care.  She is currently monitored for the following issues for this high-risk pregnancy and has Supervision of high-risk pregnancy; Hypertension in pregnancy; and Language barrier on her problem list.  Patient reports no complaints.  Contractions: Not present. Vag. Bleeding: None.  Movement: Present. Denies leaking of fluid.   The following portions of the patient's history were reviewed and updated as appropriate: allergies, current medications, past family history, past medical history, past social history, past surgical history and problem list. Problem list updated.  Objective:   Filed Vitals:   06/21/15 1127  BP: 115/75  Pulse: 95  Temp: 98 F (36.7 C)  Weight: 180 lb (81.647 kg)    Fetal Status: Fetal Heart Rate (bpm): 137 Fundal Height: 34 cm Movement: Present     General:  Alert, oriented and cooperative. Patient is in no acute distress.  Skin: Skin is warm and dry. No rash noted.   Cardiovascular: Normal heart rate noted  Respiratory: Normal respiratory effort, no problems with respiration noted  Abdomen: Soft, gravid, appropriate for gestational age. Pain/Pressure: Absent     Pelvic: Vag. Bleeding: None     Cervical exam deferred        Extremities: Normal range of motion.  Edema: None  Mental Status: Normal mood and affect. Normal behavior. Normal judgment and thought content.   Urinalysis:      Assessment and Plan:  Pregnancy: G2P1001 at [redacted]w[redacted]d  1. Hypertension in pregnancy, third trimester State fetal testing today - Fetal nonstress test normotensive Preterm labor symptoms and general obstetric precautions including but not limited to vaginal bleeding, contractions, leaking of fluid and fetal movement were reviewed in detail with the patient. Please refer to After Visit Summary for other counseling recommendations.  Return in about 1 week  (around 06/28/2015). NST 2/week  Adam Phenix, MD

## 2015-06-21 NOTE — Patient Instructions (Signed)

## 2015-06-26 ENCOUNTER — Ambulatory Visit (INDEPENDENT_AMBULATORY_CARE_PROVIDER_SITE_OTHER): Payer: Medicaid Other | Admitting: *Deleted

## 2015-06-26 VITALS — BP 110/67 | HR 84

## 2015-06-26 DIAGNOSIS — O163 Unspecified maternal hypertension, third trimester: Secondary | ICD-10-CM | POA: Diagnosis present

## 2015-06-26 DIAGNOSIS — Z36 Encounter for antenatal screening of mother: Secondary | ICD-10-CM

## 2015-06-26 NOTE — Progress Notes (Signed)
NST reviewed and reactive.  

## 2015-06-26 NOTE — Progress Notes (Signed)
Interpreter present for encounter.  

## 2015-06-28 ENCOUNTER — Ambulatory Visit (INDEPENDENT_AMBULATORY_CARE_PROVIDER_SITE_OTHER): Payer: Medicaid Other | Admitting: Obstetrics & Gynecology

## 2015-06-28 VITALS — BP 129/79 | HR 89 | Wt 179.3 lb

## 2015-06-28 DIAGNOSIS — O163 Unspecified maternal hypertension, third trimester: Secondary | ICD-10-CM | POA: Diagnosis not present

## 2015-06-28 DIAGNOSIS — O0993 Supervision of high risk pregnancy, unspecified, third trimester: Secondary | ICD-10-CM

## 2015-06-28 LAB — POCT URINALYSIS DIP (DEVICE)
Bilirubin Urine: NEGATIVE
Glucose, UA: NEGATIVE mg/dL
HGB URINE DIPSTICK: NEGATIVE
Ketones, ur: NEGATIVE mg/dL
Leukocytes, UA: NEGATIVE
NITRITE: NEGATIVE
PH: 7 (ref 5.0–8.0)
PROTEIN: NEGATIVE mg/dL
SPECIFIC GRAVITY, URINE: 1.015 (ref 1.005–1.030)
UROBILINOGEN UA: 0.2 mg/dL (ref 0.0–1.0)

## 2015-06-28 NOTE — Progress Notes (Signed)
Interpreter Win Khine present for encounter.  Breastfeeding tip of the week reviewed. Pt needs to meet w/Social Worker today to arrange transportation to her visits.

## 2015-06-28 NOTE — Progress Notes (Signed)
NST reactive Subjective:  Tamara Lawson is a 35 y.o. G2P1001 at [redacted]w[redacted]d being seen today for ongoing prenatal care.  She is currently monitored for the following issues for this high-risk pregnancy and has Supervision of high-risk pregnancy; Hypertension in pregnancy; and Language barrier on her problem list.  Patient reports no complaints.  Contractions: Not present. Vag. Bleeding: None.  Movement: Present. Denies leaking of fluid.   The following portions of the patient's history were reviewed and updated as appropriate: allergies, current medications, past family history, past medical history, past social history, past surgical history and problem list. Problem list updated.  Objective:   Filed Vitals:   06/28/15 0854  BP: 129/79  Pulse: 89  Weight: 179 lb 4.8 oz (81.33 kg)    Fetal Status: Fetal Heart Rate (bpm): NST   Movement: Present     General:  Alert, oriented and cooperative. Patient is in no acute distress.  Skin: Skin is warm and dry. No rash noted.   Cardiovascular: Normal heart rate noted  Respiratory: Normal respiratory effort, no problems with respiration noted  Abdomen: Soft, gravid, appropriate for gestational age. Pain/Pressure: Absent     Pelvic: Vag. Bleeding: None     Cervical exam deferred        Extremities: Normal range of motion.  Edema: None  Mental Status: Normal mood and affect. Normal behavior. Normal judgment and thought content.   Urinalysis: Urine Protein: Negative Urine Glucose: Negative  Assessment and Plan:  Pregnancy: G2P1001 at [redacted]w[redacted]d  1. Hypertension in pregnancy, third trimester Normotensive and reactive NST - Fetal nonstress test  2. Supervision of high-risk pregnancy, third trimester Normal growth  Preterm labor symptoms and general obstetric precautions including but not limited to vaginal bleeding, contractions, leaking of fluid and fetal movement were reviewed in detail with the patient. Please refer to After Visit Summary for other  counseling recommendations.  Return in about 4 days (around 07/02/2015) for as scheduled.   Adam Phenix, MD

## 2015-06-28 NOTE — Patient Instructions (Signed)

## 2015-07-02 ENCOUNTER — Ambulatory Visit (INDEPENDENT_AMBULATORY_CARE_PROVIDER_SITE_OTHER): Payer: Medicaid Other | Admitting: *Deleted

## 2015-07-02 VITALS — BP 115/72 | HR 88

## 2015-07-02 DIAGNOSIS — O163 Unspecified maternal hypertension, third trimester: Secondary | ICD-10-CM | POA: Diagnosis present

## 2015-07-02 NOTE — Progress Notes (Signed)
NST reactive.

## 2015-07-02 NOTE — Progress Notes (Signed)
Interpreter Win Khine present for encounter.  

## 2015-07-05 ENCOUNTER — Ambulatory Visit (INDEPENDENT_AMBULATORY_CARE_PROVIDER_SITE_OTHER): Payer: Medicaid Other | Admitting: Obstetrics & Gynecology

## 2015-07-05 VITALS — BP 118/71 | HR 79 | Wt 180.8 lb

## 2015-07-05 DIAGNOSIS — O0993 Supervision of high risk pregnancy, unspecified, third trimester: Secondary | ICD-10-CM | POA: Diagnosis not present

## 2015-07-05 DIAGNOSIS — Z36 Encounter for antenatal screening of mother: Secondary | ICD-10-CM | POA: Diagnosis not present

## 2015-07-05 DIAGNOSIS — O163 Unspecified maternal hypertension, third trimester: Secondary | ICD-10-CM | POA: Diagnosis not present

## 2015-07-05 LAB — POCT URINALYSIS DIP (DEVICE)
Bilirubin Urine: NEGATIVE
GLUCOSE, UA: NEGATIVE mg/dL
HGB URINE DIPSTICK: NEGATIVE
Ketones, ur: NEGATIVE mg/dL
LEUKOCYTES UA: NEGATIVE
NITRITE: NEGATIVE
PROTEIN: NEGATIVE mg/dL
Specific Gravity, Urine: 1.015 (ref 1.005–1.030)
UROBILINOGEN UA: 0.2 mg/dL (ref 0.0–1.0)
pH: 7 (ref 5.0–8.0)

## 2015-07-05 NOTE — Patient Instructions (Signed)
Return to clinic for any obstetric concerns or go to [facility] for evaluation  

## 2015-07-05 NOTE — Progress Notes (Signed)
Subjective:  Tamara Lawson is a 35 y.o. G2P1001 at [redacted]w[redacted]d being seen today for ongoing prenatal care.  She is currently monitored for the following issues for this high-risk pregnancy and has Supervision of high-risk pregnancy; Hypertension in pregnancy; and Language barrier on her problem list.  Patient reports no complaints.  Contractions: Not present. Vag. Bleeding: None.  Movement: Present. Denies leaking of fluid.   The following portions of the patient's history were reviewed and updated as appropriate: allergies, current medications, past family history, past medical history, past social history, past surgical history and problem list. Problem list updated.  Objective:   Filed Vitals:   07/05/15 0936  BP: 118/71  Pulse: 79  Weight: 180 lb 12.8 oz (82.01 kg)    Fetal Status: Fetal Heart Rate (bpm): NST Fundal Height: 34 cm Movement: Present  Presentation: Vertex  General:  Alert, oriented and cooperative. Patient is in no acute distress.  Skin: Skin is warm and dry. No rash noted.   Cardiovascular: Normal heart rate noted  Respiratory: Normal respiratory effort, no problems with respiration noted  Abdomen: Soft, gravid, appropriate for gestational age. Pain/Pressure: Absent     Pelvic: Vag. Bleeding: None    Cervical exam deferred        Extremities: Normal range of motion.  Edema: None  Mental Status: Normal mood and affect. Normal behavior. Normal judgment and thought content.   Urinalysis: Urine Protein: Negative Urine Glucose: Negative NST performed today was reviewed and was found to be reactive.  AFI normal at 15.9 cm.  Continue recommended antenatal testing and prenatal care.  Assessment and Plan:  Pregnancy: G2P1001 at [redacted]w[redacted]d  1. Hypertension in pregnancy, third trimester Continue antenatal testing and ultrasounds.  Stable BP.  Preeclampsia precautions reviewed. - Amniotic fluid index with NST  2. Supervision of high-risk pregnancy, third trimester Preterm labor symptoms  and general obstetric precautions including but not limited to vaginal bleeding, contractions, leaking of fluid and fetal movement were reviewed in detail with the patient. Please refer to After Visit Summary for other counseling recommendations.  Return in about 4 days (around 07/09/2015) for 2x/wk as scheduled.   Tereso Newcomer, MD

## 2015-07-05 NOTE — Progress Notes (Signed)
Interpreter Win Khine present for encounter today.  [place] for growth scheduled 2/23.

## 2015-07-09 ENCOUNTER — Ambulatory Visit (INDEPENDENT_AMBULATORY_CARE_PROVIDER_SITE_OTHER): Payer: Medicaid Other | Admitting: *Deleted

## 2015-07-09 VITALS — BP 115/72 | HR 90

## 2015-07-09 DIAGNOSIS — O163 Unspecified maternal hypertension, third trimester: Secondary | ICD-10-CM

## 2015-07-09 NOTE — Progress Notes (Signed)
NST reactive.

## 2015-07-09 NOTE — Progress Notes (Signed)
Interpreter Win Khine present for encounter.  

## 2015-07-12 ENCOUNTER — Ambulatory Visit (HOSPITAL_COMMUNITY): Payer: Medicaid Other

## 2015-07-12 ENCOUNTER — Other Ambulatory Visit: Payer: Medicaid Other

## 2015-07-16 ENCOUNTER — Ambulatory Visit (INDEPENDENT_AMBULATORY_CARE_PROVIDER_SITE_OTHER): Payer: Medicaid Other | Admitting: *Deleted

## 2015-07-16 VITALS — BP 109/66 | HR 95

## 2015-07-16 DIAGNOSIS — O163 Unspecified maternal hypertension, third trimester: Secondary | ICD-10-CM

## 2015-07-16 NOTE — Progress Notes (Signed)
NST reactive.

## 2015-07-16 NOTE — Progress Notes (Signed)
[employer] interpreter # [phone]

## 2015-07-19 ENCOUNTER — Ambulatory Visit (INDEPENDENT_AMBULATORY_CARE_PROVIDER_SITE_OTHER): Payer: Medicaid Other | Admitting: Obstetrics & Gynecology

## 2015-07-19 ENCOUNTER — Other Ambulatory Visit (HOSPITAL_COMMUNITY)
Admission: RE | Admit: 2015-07-19 | Discharge: 2015-07-19 | Disposition: A | Discharge status: Home / Self Care | Payer: Medicaid Other | Source: Ambulatory Visit | Attending: Obstetrics & Gynecology | Admitting: Obstetrics & Gynecology

## 2015-07-19 ENCOUNTER — Ambulatory Visit (HOSPITAL_COMMUNITY): Admission: RE | Admit: 2015-07-19 | Payer: Medicaid Other | Source: Ambulatory Visit

## 2015-07-19 VITALS — BP 129/84 | HR 93 | Temp 98.6°F | Wt 185.0 lb

## 2015-07-19 DIAGNOSIS — Z113 Encounter for screening for infections with a predominantly sexual mode of transmission: Secondary | ICD-10-CM | POA: Diagnosis not present

## 2015-07-19 DIAGNOSIS — O163 Unspecified maternal hypertension, third trimester: Secondary | ICD-10-CM

## 2015-07-19 DIAGNOSIS — O0993 Supervision of high risk pregnancy, unspecified, third trimester: Secondary | ICD-10-CM

## 2015-07-19 LAB — POCT URINALYSIS DIP (DEVICE)
Bilirubin Urine: NEGATIVE
GLUCOSE, UA: NEGATIVE mg/dL
Hgb urine dipstick: NEGATIVE
KETONES UR: NEGATIVE mg/dL
Nitrite: NEGATIVE
PROTEIN: NEGATIVE mg/dL
Specific Gravity, Urine: 1.015 (ref 1.005–1.030)
Urobilinogen, UA: 0.2 mg/dL (ref 0.0–1.0)
pH: 7 (ref 5.0–8.0)

## 2015-07-19 NOTE — Progress Notes (Signed)
Subjective:  Tamara Lawson is a 35 y.o. G2P1001 at [redacted]w[redacted]d being seen today for ongoing prenatal care.  She is currently monitored for the following issues for this high-risk pregnancy and has Supervision of high-risk pregnancy; Hypertension in pregnancy; and Language barrier on her problem list.  [employer] interpreter used for encounter.  Patient reports no complaints.  Contractions: Not present. Vag. Bleeding: None.  Movement: Present. Denies leaking of fluid.   The following portions of the patient's history were reviewed and updated as appropriate: allergies, current medications, past family history, past medical history, past social history, past surgical history and problem list. Problem list updated.  Objective:   Filed Vitals:   07/19/15 1100  BP: 129/84  Pulse: 93  Temp: 98.6 F (37 C)  Weight: 185 lb (83.915 kg)    Fetal Status: Fetal Heart Rate (bpm): 154 Fundal Height: 38 cm Movement: Present     General:  Alert, oriented and cooperative. Patient is in no acute distress.  Skin: Skin is warm and dry. No rash noted.   Cardiovascular: Normal heart rate noted  Respiratory: Normal respiratory effort, no problems with respiration noted  Abdomen: Soft, gravid, appropriate for gestational age. Pain/Pressure: Absent     Pelvic: Vag. Bleeding: None    Cervical exam performed Dilation: Fingertip Effacement (%): 50 Station: Ballotable  Extremities: Normal range of motion.  Edema: None  Mental Status: Normal mood and affect. Normal behavior. Normal judgment and thought content.   Urinalysis: Urine Protein: Negative Urine Glucose: Negative NST performed today was reviewed and was found to be reactive.  AFI was also normal.    Assessment and Plan:  Pregnancy: G2P1001 at [redacted]w[redacted]d  1. Hypertension in pregnancy, third trimester Stable BP. Continue recommended antenatal testing and prenatal care.  2. Supervision of high-risk pregnancy, third trimester Preterm labor symptoms and general obstetric  precautions including but not limited to vaginal bleeding, contractions, leaking of fluid and fetal movement were reviewed in detail with the patient. Please refer to After Visit Summary for other counseling recommendations.  Return for scheduled antenatal testing and OB visit.   Tereso Newcomer, MD

## 2015-07-19 NOTE — Patient Instructions (Signed)
Return to clinic for any obstetric concerns or go to [facility] for evaluation  

## 2015-07-19 NOTE — Progress Notes (Signed)
[id number] 

## 2015-07-19 NOTE — Progress Notes (Signed)
[employer] interpreter [phone]

## 2015-07-20 LAB — GC/CHLAMYDIA PROBE AMP (~~LOC~~) NOT AT ARMC
Chlamydia: NEGATIVE
NEISSERIA GONORRHEA: NEGATIVE

## 2015-07-21 LAB — CULTURE, BETA STREP (GROUP B ONLY)

## 2015-07-23 ENCOUNTER — Ambulatory Visit (INDEPENDENT_AMBULATORY_CARE_PROVIDER_SITE_OTHER): Payer: Medicaid Other | Admitting: *Deleted

## 2015-07-23 VITALS — BP 116/66 | HR 85

## 2015-07-23 DIAGNOSIS — O163 Unspecified maternal hypertension, third trimester: Secondary | ICD-10-CM | POA: Diagnosis present

## 2015-07-23 LAB — FETAL NONSTRESS TEST

## 2015-07-23 NOTE — Addendum Note (Signed)
Addended by: Jill SideAY, DIANE L on: 07/23/2015 04:51 PM   Modules accepted: Orders

## 2015-07-23 NOTE — Progress Notes (Signed)
Interpreter Win Khine present for encounter.  

## 2015-07-26 ENCOUNTER — Other Ambulatory Visit: Payer: Medicaid Other

## 2015-07-26 ENCOUNTER — Other Ambulatory Visit (HOSPITAL_COMMUNITY): Payer: Self-pay | Admitting: Maternal and Fetal Medicine

## 2015-07-26 ENCOUNTER — Ambulatory Visit (HOSPITAL_COMMUNITY)
Admission: RE | Admit: 2015-07-26 | Discharge: 2015-07-26 | Disposition: A | Discharge status: Home / Self Care | Payer: Medicaid Other | Source: Ambulatory Visit | Attending: Maternal and Fetal Medicine | Admitting: Maternal and Fetal Medicine

## 2015-07-26 ENCOUNTER — Ambulatory Visit (INDEPENDENT_AMBULATORY_CARE_PROVIDER_SITE_OTHER): Payer: Medicaid Other | Admitting: Medical

## 2015-07-26 ENCOUNTER — Encounter (HOSPITAL_COMMUNITY): Payer: Self-pay

## 2015-07-26 VITALS — BP 117/77 | HR 87

## 2015-07-26 DIAGNOSIS — O10013 Pre-existing essential hypertension complicating pregnancy, third trimester: Secondary | ICD-10-CM | POA: Diagnosis not present

## 2015-07-26 DIAGNOSIS — O163 Unspecified maternal hypertension, third trimester: Secondary | ICD-10-CM | POA: Diagnosis not present

## 2015-07-26 DIAGNOSIS — Z3A37 37 weeks gestation of pregnancy: Secondary | ICD-10-CM | POA: Insufficient documentation

## 2015-07-26 DIAGNOSIS — O10913 Unspecified pre-existing hypertension complicating pregnancy, third trimester: Secondary | ICD-10-CM

## 2015-07-26 LAB — POCT URINALYSIS DIP (DEVICE)
Glucose, UA: NEGATIVE mg/dL
Ketones, ur: NEGATIVE mg/dL
NITRITE: NEGATIVE
PH: 6 (ref 5.0–8.0)
PROTEIN: 100 mg/dL — AB
Specific Gravity, Urine: 1.025 (ref 1.005–1.030)
UROBILINOGEN UA: 0.2 mg/dL (ref 0.0–1.0)

## 2015-07-26 LAB — CBC
HCT: 34.3 % — ABNORMAL LOW (ref 36.0–46.0)
Hemoglobin: 11.3 g/dL — ABNORMAL LOW (ref 12.0–15.0)
MCH: 28 pg (ref 26.0–34.0)
MCHC: 32.9 g/dL (ref 30.0–36.0)
MCV: 84.9 fL (ref 78.0–100.0)
MPV: 9.5 fL (ref 8.6–12.4)
Platelets: 348 10*3/uL (ref 150–400)
RBC: 4.04 MIL/uL (ref 3.87–5.11)
RDW: 13.3 % (ref 11.5–15.5)
WBC: 12.4 10*3/uL — AB (ref 4.0–10.5)

## 2015-07-26 LAB — COMPREHENSIVE METABOLIC PANEL
ALT: 8 U/L (ref 6–29)
AST: 13 U/L (ref 10–30)
Albumin: 3.3 g/dL — ABNORMAL LOW (ref 3.6–5.1)
Alkaline Phosphatase: 154 U/L — ABNORMAL HIGH (ref 33–115)
BILIRUBIN TOTAL: 0.7 mg/dL (ref 0.2–1.2)
BUN: 13 mg/dL (ref 7–25)
CHLORIDE: 102 mmol/L (ref 98–110)
CO2: 22 mmol/L (ref 20–31)
CREATININE: 0.6 mg/dL (ref 0.50–1.10)
Calcium: 9 mg/dL (ref 8.6–10.2)
GLUCOSE: 71 mg/dL (ref 65–99)
Potassium: 4.4 mmol/L (ref 3.5–5.3)
SODIUM: 135 mmol/L (ref 135–146)
Total Protein: 6.3 g/dL (ref 6.1–8.1)

## 2015-07-26 NOTE — Progress Notes (Signed)
Interpreter Tamara Lawson present for encounter. US for growth done today.

## 2015-07-26 NOTE — Patient Instructions (Signed)
Braxton Hicks Contractions °Contractions of the uterus can occur throughout pregnancy. Contractions are not always a sign that you are in labor.  °WHAT ARE BRAXTON HICKS CONTRACTIONS?  °Contractions that occur before labor are called Braxton Hicks contractions, or false labor. Toward the end of pregnancy (32-34 weeks), these contractions can develop more often and may become more forceful. This is not true labor because these contractions do not result in opening (dilatation) and thinning of the cervix. They are sometimes difficult to tell apart from true labor because these contractions can be forceful and people have different pain tolerances. You should not feel embarrassed if you go to the hospital with false labor. Sometimes, the only way to tell if you are in true labor is for your health care provider to look for changes in the cervix. °If there are no prenatal problems or other health problems associated with the pregnancy, it is completely safe to be sent home with false labor and await the onset of true labor. °HOW CAN YOU TELL THE DIFFERENCE BETWEEN TRUE AND FALSE LABOR? °False Labor °· The contractions of false labor are usually shorter and not as hard as those of true labor.   °· The contractions are usually irregular.   °· The contractions are often felt in the front of the lower abdomen and in the groin.   °· The contractions may go away when you walk around or change positions while lying down.   °· The contractions get weaker and are shorter lasting as time goes on.   °· The contractions do not usually become progressively stronger, regular, and closer together as with true labor.   °True Labor °· Contractions in true labor last 30-70 seconds, become very regular, usually become more intense, and increase in frequency.   °· The contractions do not go away with walking.   °· The discomfort is usually felt in the top of the uterus and spreads to the lower abdomen and low back.   °· True labor can be  determined by your health care provider with an exam. This will show that the cervix is dilating and getting thinner.   °WHAT TO REMEMBER °· Keep up with your usual exercises and follow other instructions given by your health care provider.   °· Take medicines as directed by your health care provider.   °· Keep your regular prenatal appointments.   °· Eat and drink lightly if you think you are going into labor.   °· If Braxton Hicks contractions are making you uncomfortable:   °¨ Change your position from lying down or resting to walking, or from walking to resting.   °¨ Sit and rest in a tub of warm water.   °¨ Drink 2-3 glasses of water. Dehydration may cause these contractions.   °¨ Do slow and deep breathing several times an hour.   °WHEN SHOULD I SEEK IMMEDIATE MEDICAL CARE? °Seek immediate medical care if: °· Your contractions become stronger, more regular, and closer together.   °· You have fluid leaking or gushing from your vagina.   °· You have a fever.   °· You pass blood-tinged mucus.   °· You have vaginal bleeding.   °· You have continuous abdominal pain.   °· You have low back pain that you never had before.   °· You feel your baby's head pushing down and causing pelvic pressure.   °· Your baby is not moving as much as it used to.   °  °This information is not intended to replace advice given to you by your health care provider. Make sure you discuss any questions you have with your health care   provider. °  °Document Released: 05/05/2005 Document Revised: 05/10/2013 Document Reviewed: 02/14/2013 °Elsevier Interactive Patient Education ©2016 Elsevier Inc. ° °

## 2015-07-26 NOTE — Progress Notes (Signed)
Subjective:  Tamara Lawson is a 35 y.o. G2P1001 at 1849w4d being seen today for ongoing prenatal care.  She is currently monitored for the following issues for this high-risk pregnancy and has Supervision of high-risk pregnancy; Hypertension in pregnancy; and Language barrier on her problem list.  Patient reports no complaints.  Contractions: Not present. Vag. Bleeding: None.  Movement: Present. Denies leaking of fluid.   The following portions of the patient's history were reviewed and updated as appropriate: allergies, current medications, past family history, past medical history, past social history, past surgical history and problem list. Problem list updated.  Objective:   Filed Vitals:   07/26/15 1430  BP: 117/77  Pulse: 87    Fetal Status: Fetal Heart Rate (bpm): NST Fundal Height: 38 cm Movement: Present     General:  Alert, oriented and cooperative. Patient is in no acute distress.  Skin: Skin is warm and dry. No rash noted.   Cardiovascular: Normal heart rate noted  Respiratory: Normal respiratory effort, no problems with respiration noted  Abdomen: Soft, gravid, appropriate for gestational age. Pain/Pressure: Present     Pelvic: Vag. Bleeding: None     Cervical exam deferred        Extremities: Normal range of motion.  Edema: None  Mental Status: Normal mood and affect. Normal behavior. Normal judgment and thought content.   Urinalysis: Urine Protein: 2+ Urine Glucose: Negative  Assessment and Plan:  Pregnancy: G2P1001 at 3549w4d  1. Hypertension in pregnancy, third trimester - Normotensive today with 2+ protein, asymptomatic - CBC, CMP and Urine Protein/Creatinine ratio today  Term labor symptoms and general obstetric precautions including but not limited to vaginal bleeding, contractions, leaking of fluid and fetal movement were reviewed in detail with the patient. Warning signs for HTN and Pre-eclampsia reviewed Please refer to After Visit Summary for other counseling  recommendations.  Return in about 4 days (around 07/30/2015) for 2x/wk as scheduled.   Marny LowensteinJulie N Jerrit Horen, PA-C

## 2015-07-27 ENCOUNTER — Encounter: Payer: Self-pay | Admitting: *Deleted

## 2015-07-27 LAB — PROTEIN / CREATININE RATIO, URINE
CREATININE, URINE: 336 mg/dL — AB (ref 20–320)
PROTEIN CREATININE RATIO: 262 mg/g{creat} — AB (ref 21–161)
Total Protein, Urine: 88 mg/dL — ABNORMAL HIGH (ref 5–24)

## 2015-07-30 ENCOUNTER — Ambulatory Visit (INDEPENDENT_AMBULATORY_CARE_PROVIDER_SITE_OTHER): Payer: Medicaid Other | Admitting: *Deleted

## 2015-07-30 VITALS — BP 128/78 | HR 80

## 2015-07-30 DIAGNOSIS — O163 Unspecified maternal hypertension, third trimester: Secondary | ICD-10-CM | POA: Diagnosis present

## 2015-07-30 NOTE — Progress Notes (Signed)
Interpreter Win Khine present for encounter.  

## 2015-08-02 ENCOUNTER — Ambulatory Visit (INDEPENDENT_AMBULATORY_CARE_PROVIDER_SITE_OTHER): Payer: Medicaid Other | Admitting: Obstetrics & Gynecology

## 2015-08-02 VITALS — BP 114/76 | HR 93 | Wt 186.2 lb

## 2015-08-02 DIAGNOSIS — Z36 Encounter for antenatal screening of mother: Secondary | ICD-10-CM | POA: Diagnosis not present

## 2015-08-02 DIAGNOSIS — O163 Unspecified maternal hypertension, third trimester: Secondary | ICD-10-CM | POA: Diagnosis present

## 2015-08-02 LAB — POCT URINALYSIS DIP (DEVICE)
BILIRUBIN URINE: NEGATIVE
Glucose, UA: NEGATIVE mg/dL
HGB URINE DIPSTICK: NEGATIVE
Ketones, ur: NEGATIVE mg/dL
NITRITE: NEGATIVE
PH: 7 (ref 5.0–8.0)
Protein, ur: NEGATIVE mg/dL
Specific Gravity, Urine: 1.015 (ref 1.005–1.030)
Urobilinogen, UA: 0.2 mg/dL (ref 0.0–1.0)

## 2015-08-02 NOTE — Progress Notes (Signed)
Interpreter Win Khine present for encounter.  Discussed IOL @ 40 wks.

## 2015-08-02 NOTE — Patient Instructions (Signed)
Labor Induction Labor induction is when steps are taken to cause a pregnant woman to begin the labor process. Most women go into labor on their own between 37 weeks and 42 weeks of the pregnancy. When this does not happen or when there is a medical need, methods may be used to induce labor. Labor induction causes a pregnant woman's uterus to contract. It also causes the cervix to soften (ripen), open (dilate), and thin out (efface). Usually, labor is not induced before 39 weeks of the pregnancy unless there is a problem with the baby or mother.  Before inducing labor, your health care provider will consider a number of factors, including the following:  The medical condition of you and the baby.   How many weeks along you are.   The status of the baby's lung maturity.   The condition of the cervix.   The position of the baby.  WHAT ARE THE REASONS FOR LABOR INDUCTION? Labor may be induced for the following reasons:  The health of the baby or mother is at risk.   The pregnancy is overdue by 1 week or more.   The water breaks but labor does not start on its own.   The mother has a health condition or serious illness, such as high blood pressure, infection, placental abruption, or diabetes.  The amniotic fluid amounts are low around the baby.   The baby is distressed.  Convenience or wanting the baby to be born on a certain date is not a reason for inducing labor. WHAT METHODS ARE USED FOR LABOR INDUCTION? Several methods of labor induction may be used, such as:   Prostaglandin medicine. This medicine causes the cervix to dilate and ripen. The medicine will also start contractions. It can be taken by mouth or by inserting a suppository into the vagina.   Inserting a thin tube (catheter) with a balloon on the end into the vagina to dilate the cervix. Once inserted, the balloon is expanded with water, which causes the cervix to open.   Stripping the membranes. Your health  care provider separates amniotic sac tissue from the cervix, causing the cervix to be stretched and causing the release of a hormone called progesterone. This may cause the uterus to contract. It is often done during an office visit. You will be sent home to wait for the contractions to begin. You will then come in for an induction.   Breaking the water. Your health care provider makes a hole in the amniotic sac using a small instrument. Once the amniotic sac breaks, contractions should begin. This may still take hours to see an effect.   Medicine to trigger or strengthen contractions. This medicine is given through an IV access tube inserted into a vein in your arm.  All of the methods of induction, besides stripping the membranes, will be done in the hospital. Induction is done in the hospital so that you and the baby can be carefully monitored.  HOW LONG DOES IT TAKE FOR LABOR TO BE INDUCED? Some inductions can take up to 2-3 days. Depending on the cervix, it usually takes less time. It takes longer when you are induced early in the pregnancy or if this is your first pregnancy. If a mother is still pregnant and the induction has been going on for 2-3 days, either the mother will be sent home or a cesarean delivery will be needed. WHAT ARE THE RISKS ASSOCIATED WITH LABOR INDUCTION? Some of the risks of induction include:     Changes in fetal heart rate, such as too high, too low, or erratic.   Fetal distress.   Chance of infection for the mother and baby.   Increased chance of having a cesarean delivery.   Breaking off (abruption) of the placenta from the uterus (rare).   Uterine rupture (very rare).  When induction is needed for medical reasons, the benefits of induction may outweigh the risks. WHAT ARE SOME REASONS FOR NOT INDUCING LABOR? Labor induction should not be done if:   It is shown that your baby does not tolerate labor.   You have had previous surgeries on your  uterus, such as a myomectomy or the removal of fibroids.   Your placenta lies very low in the uterus and blocks the opening of the cervix (placenta previa).   Your baby is not in a head-down position.   The umbilical cord drops down into the birth canal in front of the baby. This could cut off the baby's blood and oxygen supply.   You have had a previous cesarean delivery.   There are unusual circumstances, such as the baby being extremely premature.    This information is not intended to replace advice given to you by your health care provider. Make sure you discuss any questions you have with your health care provider.   Document Released: 09/24/2006 Document Revised: 05/26/2014 Document Reviewed: 12/02/2012 Elsevier Interactive Patient Education 2016 Elsevier Inc.  

## 2015-08-02 NOTE — Progress Notes (Signed)
NST reactive today, AFI nl  Subjective:  Tamara Lawson is a 35 y.o. G2P1001 at 4558w4d being seen today for ongoing prenatal care.  She is currently monitored for the following issues for this high-risk pregnancy and has Supervision of high-risk pregnancy; Hypertension in pregnancy; and Language barrier on her problem list.  Patient reports no complaints.  Contractions: Not present. Vag. Bleeding: None.  Movement: Present. Denies leaking of fluid.   The following portions of the patient's history were reviewed and updated as appropriate: allergies, current medications, past family history, past medical history, past social history, past surgical history and problem list. Problem list updated.  Objective:   Filed Vitals:   08/02/15 1046  BP: 114/76  Pulse: 93  Weight: 186 lb 3.2 oz (84.46 kg)    Fetal Status: Fetal Heart Rate (bpm): NST   Movement: Present  Presentation: Vertex  General:  Alert, oriented and cooperative. Patient is in no acute distress.  Skin: Skin is warm and dry. No rash noted.   Cardiovascular: Normal heart rate noted  Respiratory: Normal respiratory effort, no problems with respiration noted  Abdomen: Soft, gravid, appropriate for gestational age. Pain/Pressure: Present     Pelvic: Vag. Bleeding: None     Cervical exam deferred        Extremities: Normal range of motion.  Edema: None  Mental Status: Normal mood and affect. Normal behavior. Normal judgment and thought content.   Urinalysis: Urine Protein: Negative Urine Glucose: Negative  Assessment and Plan:  Pregnancy: G2P1001 at 6658w4d  1. Hypertension in pregnancy, third trimester BP nl no meds  Term labor symptoms and general obstetric precautions including but not limited to vaginal bleeding, contractions, leaking of fluid and fetal movement were reviewed in detail with the patient. Please refer to After Visit Summary for other counseling recommendations.  Return in about 4 days (around 08/06/2015) for as  scheduled. IOL 40 weeks  Adam PhenixJames G Arnold, MD

## 2015-08-02 NOTE — Addendum Note (Signed)
Addended by: Jill SideAY, Oleva Koo L on: 08/02/2015 03:31 PM   Modules accepted: Orders

## 2015-08-06 ENCOUNTER — Other Ambulatory Visit: Payer: Medicaid Other

## 2015-08-09 ENCOUNTER — Other Ambulatory Visit: Payer: Medicaid Other

## 2016-02-09 ENCOUNTER — Encounter (HOSPITAL_COMMUNITY): Payer: Self-pay

## 2016-05-13 IMAGING — US US MFM OB FOLLOW-UP
1 series · 12 of 28 positions shown · non-contrast
Comparison: none

[Series 1: us mfm ob follow-up · 33 acquisitions, 12 frames shown]
[im 2/33]
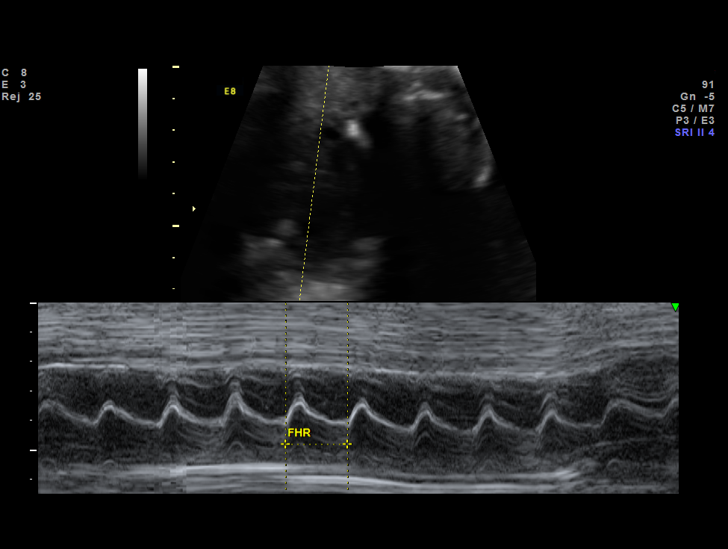
[im 4/33]
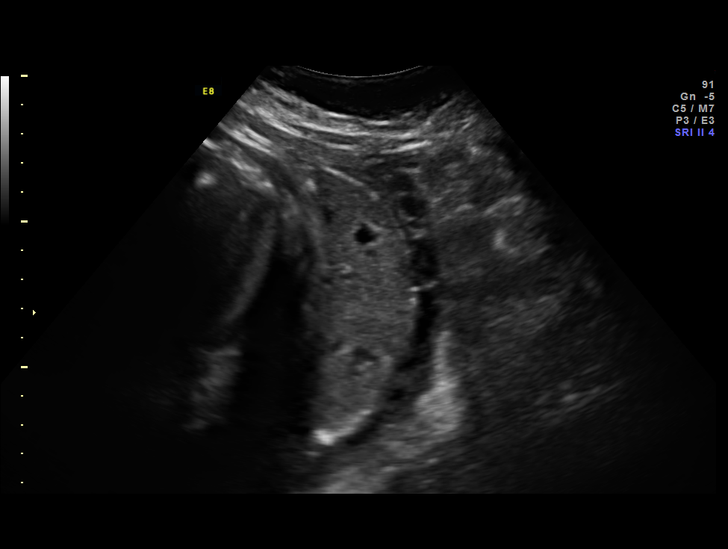
[im 6/33]
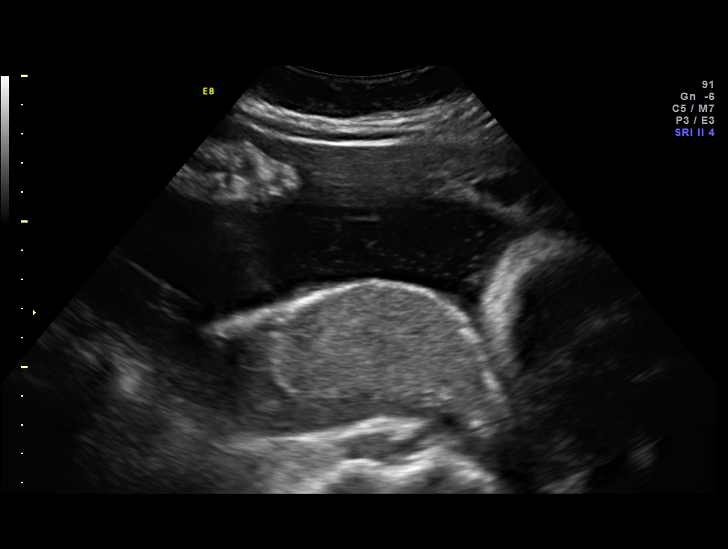
[im 10/33]
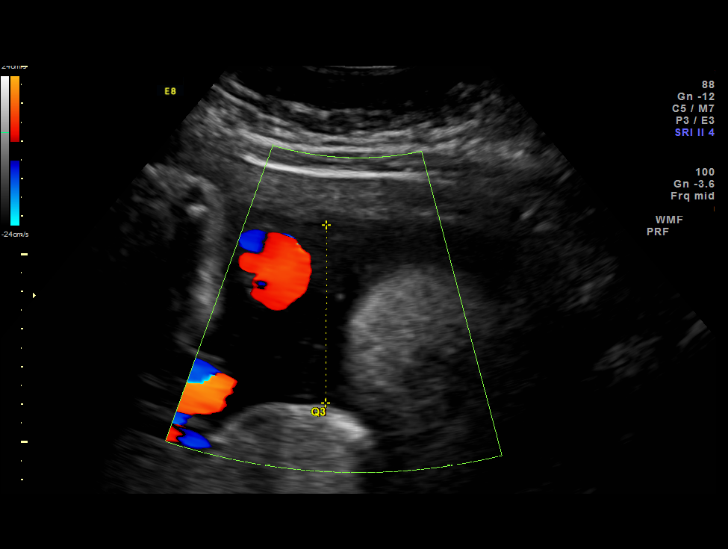
[im 12/33]
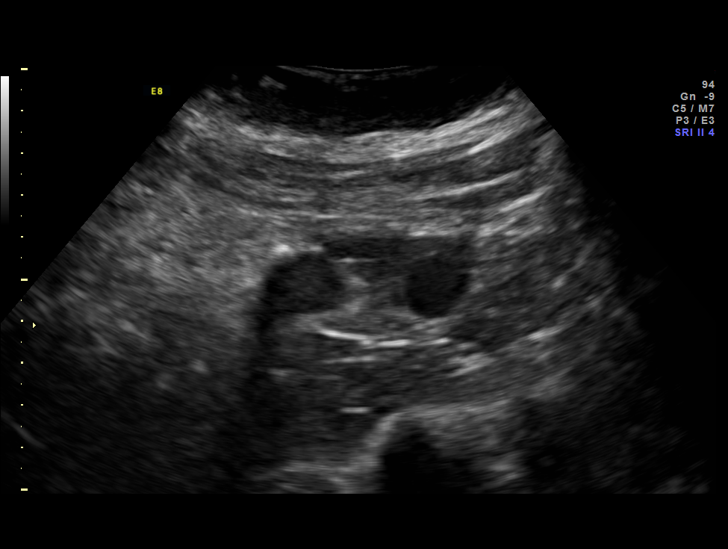
[im 15/33]
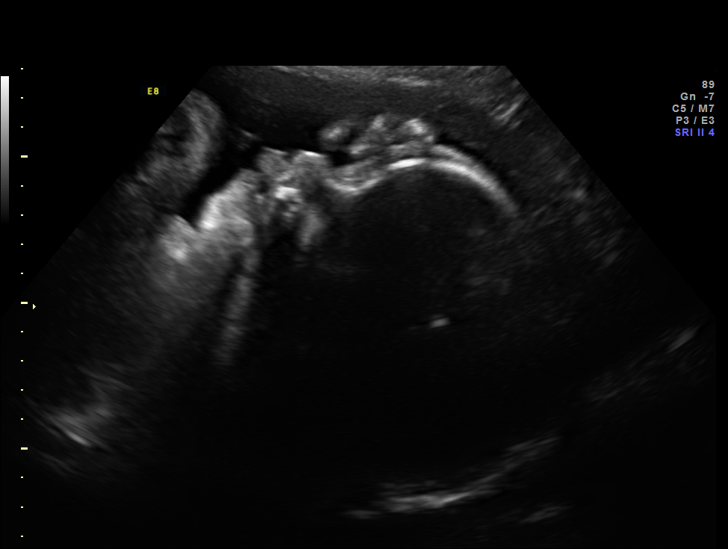
[im 18/33]
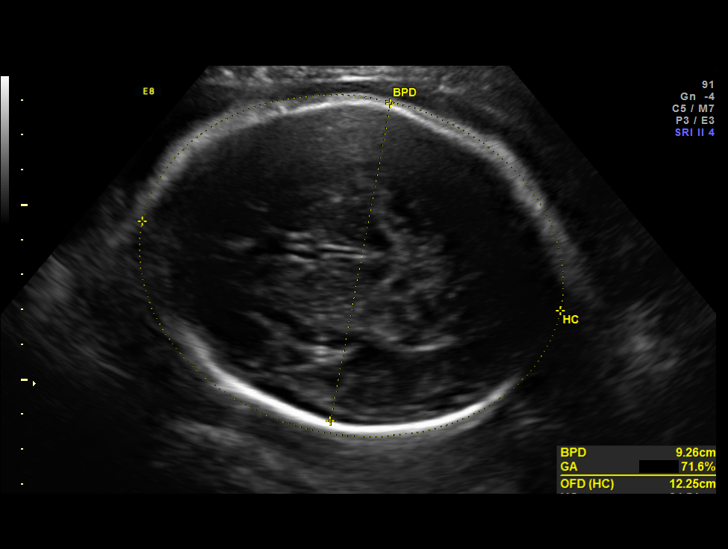
[im 21/33]
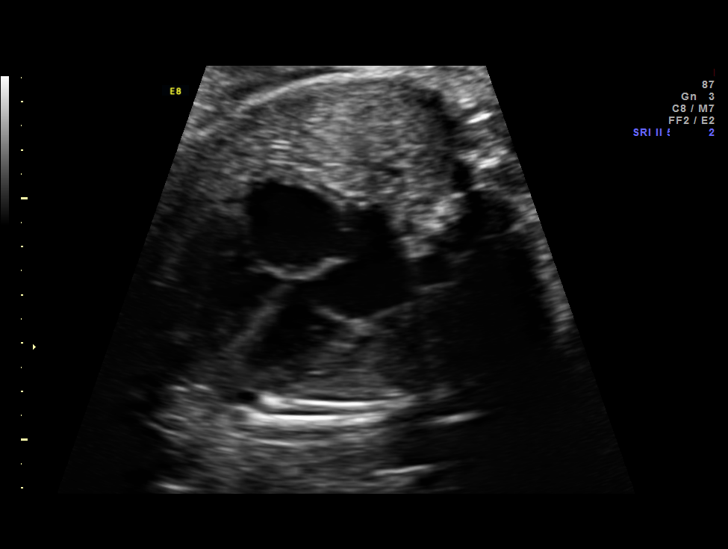
[im 23/33]
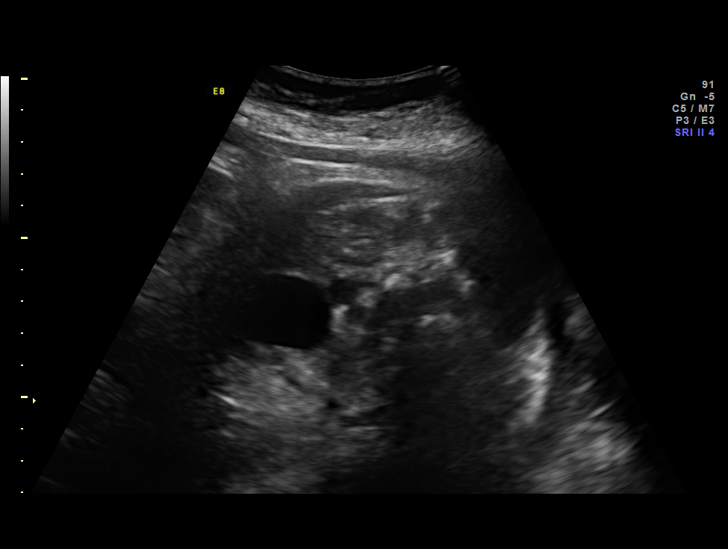
[im 27/33]
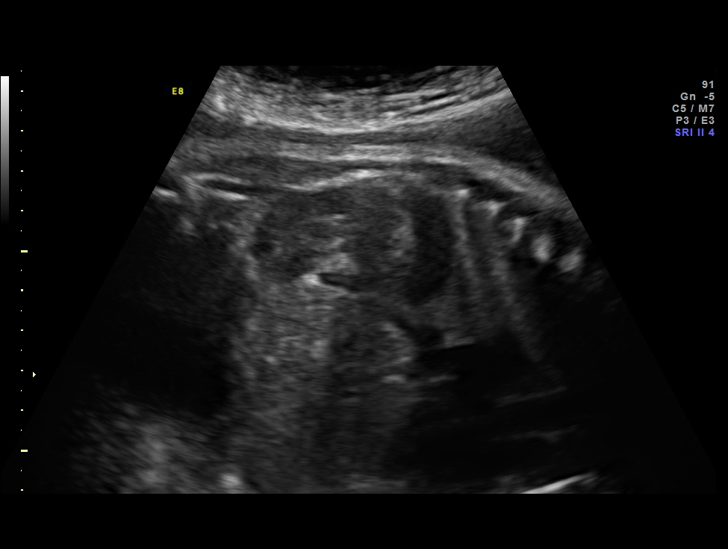
[im 29/33]
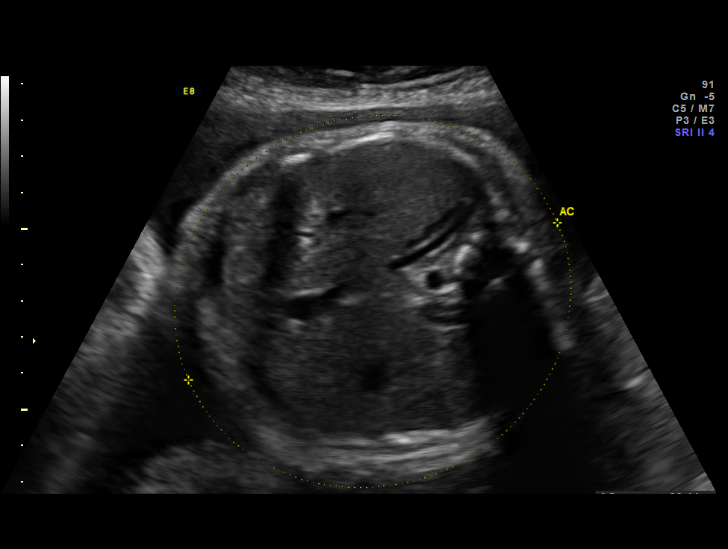
[im 31/33]
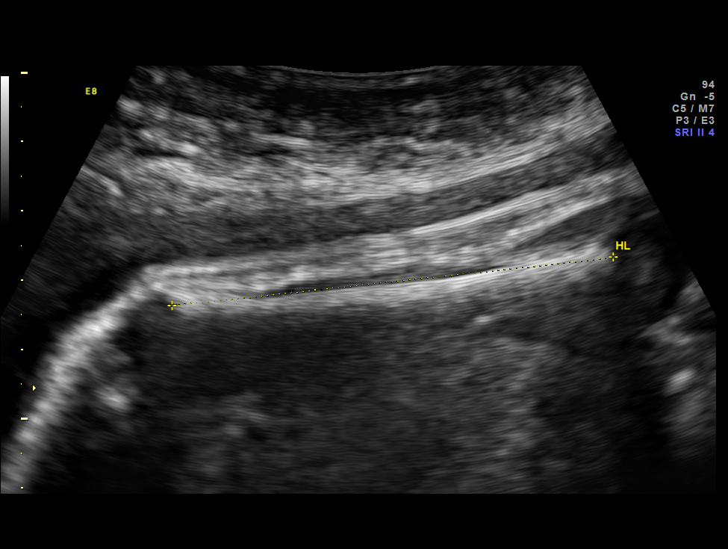

[12 of 28 positions shown; findings below may reference images not displayed]

[facility]
[REDACTED]-
Faculty Physician

1  SULISTIANI FADIR            [id number]      [id number]     [id number]
Indications

37 weeks gestation of pregnancy
Hypertension - Chronic/Pre-existing
OB History

Gravidity:    2         Term:   1        Prem:   0        SAB:   0
TOP:          0       Ectopic:  0        Living: 1
Fetal Evaluation

Num Of Fetuses:     1
Fetal Heart         163
Rate(bpm):
Cardiac Activity:   Observed
Presentation:       Cephalic
Placenta:           Posterior, above cervical os
P. Cord Insertion:  Previously Visualized

Amniotic Fluid
AFI FV:      Subjectively within normal limits
AFI Sum:     17.91   cm       69  %Tile     Larg Pckt:    5.63  cm
RUQ:   3.59    cm   RLQ:    3.95   cm    LUQ:   5.63    cm   LLQ:    4.74   cm
Biometry

BPD:      93.2  mm     G. Age:  37w 6d                  CI:         75.8   %    70 - 86
OFD:     122.9  mm                                      FL/HC:      20.6   %    20.9 -
HC:      346.1  mm     G. Age:  40w 1d         83  %    HC/AC:      1.03        0.92 -
AC:      337.2  mm     G. Age:  37w 4d         67  %    FL/BPD:     76.5   %    71 - 87
FL:       71.3  mm     G. Age:  36w 4d         25  %    FL/AC:      21.1   %    20 - 24
HUM:        63  mm     G. Age:  36w 4d         55  %

Est. FW:    6337  gm      7 lb 3 oz     76  %
Gestational Age

LMP:           32w 1d        Date:  12/13/14                 EDD:   09/19/15
Clinical EDD:  37w 4d                                        EDD:   08/12/15
U/S Today:     38w 0d                                        EDD:   08/09/15
Best:          37w 4d     Det. By:  Clinical EDD             EDD:   08/12/15
Anatomy

Cranium:          Appears normal         Aortic Arch:      Previously seen
Fetal Cavum:      Appears normal         Ductal Arch:      Previously seen
Ventricles:       Appears normal         Diaphragm:        Previously seen
Choroid Plexus:   Previously seen        Stomach:          Appears normal, left
sided
Cerebellum:       Previously seen        Abdomen:          Appears normal
Posterior Fossa:  Previously seen        Abdominal Wall:   Previously seen
Nuchal Fold:      Not applicable (>20    Cord Vessels:     Previously seen
wks GA)
Face:             Orbits and profile     Kidneys:          Appear normal
previously seen
Lips:             Previously seen        Bladder:          Appears normal
Fetal Thoracic:   Appears normal         Spine:            Previously seen
Heart:            Appears normal         Upper             Previously seen
(4CH, axis, and        Extremities:
situs)
RVOT:             Previously seen        Lower             Previously seen
Extremities:
LVOT:             Previously seen

Other:  Male gender previously seen. Heels and 5th digit previously
visualized. Technically difficult due to advanced GA and fetal position.
Cervix Uterus Adnexa

Cervix
Not visualized (advanced GA >53wks)

Left Ovary
Within normal limits.

Right Ovary
Within normal limits.

Adnexa:       No abnormality visualized.
Impression

Single IUP at 37w 4d, CHTN
Interval growth is appropriate (76th %tile)
no dysmorphic features
Posterior placenta without previa
Normal amniotic fluid volume
Recommendations

Continued antenatal testing until delivery at 38-39 weeks.
